# Patient Record
Sex: Female | Born: 1980 | Race: White | Hispanic: No | Marital: Married | Smoking: Former smoker
Health system: Southern US, Community
[De-identification: ages and names within clinical notes are randomized; demographics above are authoritative.]

## PROBLEM LIST (undated history)

## (undated) ENCOUNTER — Emergency Department (HOSPITAL_COMMUNITY): Admission: EM | Discharge status: Absent without leave | Payer: Self-pay

## (undated) DIAGNOSIS — O1490 Unspecified pre-eclampsia, unspecified trimester: Secondary | ICD-10-CM

## (undated) DIAGNOSIS — O10919 Unspecified pre-existing hypertension complicating pregnancy, unspecified trimester: Secondary | ICD-10-CM

## (undated) DIAGNOSIS — T7840XA Allergy, unspecified, initial encounter: Secondary | ICD-10-CM

## (undated) DIAGNOSIS — F419 Anxiety disorder, unspecified: Secondary | ICD-10-CM

## (undated) HISTORY — DX: Allergy, unspecified, initial encounter: T78.40XA

## (undated) HISTORY — DX: Anxiety disorder, unspecified: F41.9

## (undated) HISTORY — DX: Unspecified pre-existing hypertension complicating pregnancy, unspecified trimester: O10.919

## (undated) HISTORY — DX: Unspecified pre-eclampsia, unspecified trimester: O14.90

---
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
## 2002-07-31 ENCOUNTER — Other Ambulatory Visit: Admission: RE | Admit: 2002-07-31 | Discharge: 2002-07-31 | Payer: Self-pay | Admitting: Obstetrics and Gynecology

## 2002-11-01 ENCOUNTER — Inpatient Hospital Stay (HOSPITAL_COMMUNITY): Admission: AD | Admit: 2002-11-01 | Discharge: 2002-11-01 | Payer: Self-pay | Admitting: Obstetrics and Gynecology

## 2002-11-16 ENCOUNTER — Inpatient Hospital Stay (HOSPITAL_COMMUNITY): Admission: AD | Admit: 2002-11-16 | Discharge: 2002-11-19 | Payer: Self-pay | Admitting: Obstetrics and Gynecology

## 2003-02-14 ENCOUNTER — Other Ambulatory Visit: Admission: RE | Admit: 2003-02-14 | Discharge: 2003-02-14 | Payer: Self-pay | Admitting: Obstetrics and Gynecology

## 2004-09-03 ENCOUNTER — Emergency Department: Payer: Self-pay | Admitting: Emergency Medicine

## 2004-09-05 ENCOUNTER — Inpatient Hospital Stay (HOSPITAL_COMMUNITY): Admission: AD | Admit: 2004-09-05 | Discharge: 2004-09-05 | Payer: Self-pay | Admitting: Obstetrics and Gynecology

## 2004-09-07 ENCOUNTER — Inpatient Hospital Stay (HOSPITAL_COMMUNITY): Admission: AD | Admit: 2004-09-07 | Discharge: 2004-09-07 | Payer: Self-pay | Admitting: Obstetrics and Gynecology

## 2005-12-28 ENCOUNTER — Other Ambulatory Visit: Admission: RE | Admit: 2005-12-28 | Discharge: 2005-12-28 | Payer: Self-pay | Admitting: Obstetrics and Gynecology

## 2006-07-18 ENCOUNTER — Inpatient Hospital Stay (HOSPITAL_COMMUNITY): Admission: RE | Admit: 2006-07-18 | Discharge: 2006-07-20 | Payer: Self-pay | Admitting: Obstetrics and Gynecology

## 2006-08-26 ENCOUNTER — Other Ambulatory Visit: Admission: RE | Admit: 2006-08-26 | Discharge: 2006-08-26 | Payer: Self-pay | Admitting: Obstetrics and Gynecology

## 2007-01-23 ENCOUNTER — Other Ambulatory Visit: Admission: RE | Admit: 2007-01-23 | Discharge: 2007-01-23 | Payer: Self-pay | Admitting: Obstetrics and Gynecology

## 2010-12-27 ENCOUNTER — Emergency Department (HOSPITAL_COMMUNITY): Payer: BC Managed Care – PPO

## 2010-12-27 ENCOUNTER — Emergency Department (HOSPITAL_COMMUNITY)
Admission: EM | Admit: 2010-12-27 | Discharge: 2010-12-27 | Disposition: A | Discharge status: Home / Self Care | Payer: BC Managed Care – PPO | Attending: Emergency Medicine | Admitting: Emergency Medicine

## 2010-12-27 DIAGNOSIS — S025XXA Fracture of tooth (traumatic), initial encounter for closed fracture: Secondary | ICD-10-CM | POA: Insufficient documentation

## 2010-12-27 DIAGNOSIS — W010XXA Fall on same level from slipping, tripping and stumbling without subsequent striking against object, initial encounter: Secondary | ICD-10-CM | POA: Insufficient documentation

## 2010-12-27 DIAGNOSIS — IMO0002 Reserved for concepts with insufficient information to code with codable children: Secondary | ICD-10-CM | POA: Insufficient documentation

## 2010-12-27 DIAGNOSIS — S0180XA Unspecified open wound of other part of head, initial encounter: Secondary | ICD-10-CM | POA: Insufficient documentation

## 2010-12-27 DIAGNOSIS — S0990XA Unspecified injury of head, initial encounter: Secondary | ICD-10-CM | POA: Insufficient documentation

## 2010-12-27 DIAGNOSIS — M542 Cervicalgia: Secondary | ICD-10-CM | POA: Insufficient documentation

## 2010-12-27 DIAGNOSIS — S022XXA Fracture of nasal bones, initial encounter for closed fracture: Secondary | ICD-10-CM | POA: Insufficient documentation

## 2010-12-27 DIAGNOSIS — F411 Generalized anxiety disorder: Secondary | ICD-10-CM | POA: Insufficient documentation

## 2010-12-27 DIAGNOSIS — S01501A Unspecified open wound of lip, initial encounter: Secondary | ICD-10-CM | POA: Insufficient documentation

## 2010-12-27 LAB — POCT PREGNANCY, URINE: Preg Test, Ur: NEGATIVE
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
## 2016-08-09 DIAGNOSIS — Z01419 Encounter for gynecological examination (general) (routine) without abnormal findings: Secondary | ICD-10-CM | POA: Diagnosis not present

## 2016-08-09 DIAGNOSIS — Z6826 Body mass index (BMI) 26.0-26.9, adult: Secondary | ICD-10-CM | POA: Diagnosis not present

## 2016-09-14 DIAGNOSIS — I10 Essential (primary) hypertension: Secondary | ICD-10-CM | POA: Diagnosis not present

## 2016-09-14 DIAGNOSIS — Z6826 Body mass index (BMI) 26.0-26.9, adult: Secondary | ICD-10-CM | POA: Diagnosis not present

## 2016-10-01 ENCOUNTER — Encounter: Payer: Self-pay | Admitting: Family Medicine

## 2017-07-13 ENCOUNTER — Other Ambulatory Visit: Payer: Self-pay | Admitting: Family Medicine

## 2017-07-13 MED ORDER — VARENICLINE TARTRATE 0.5 MG X 11 & 1 MG X 42 PO MISC: ORAL | 0 refills | Status: DC

## 2017-07-13 NOTE — Progress Notes (Signed)
Patient is a mother of a patient seen for a well-child check. She is going to be a new patient in my office. She is very interested in smoking cessation. She is currently only taking lisinopril. I told the patient that I would gladly prescribe Chantix which she is interested in. She'll go ahead and start the starter pack and I will follow-up with the patient within the next month with the physical and lab work.

## 2017-07-18 ENCOUNTER — Ambulatory Visit (INDEPENDENT_AMBULATORY_CARE_PROVIDER_SITE_OTHER): Payer: BLUE CROSS/BLUE SHIELD | Admitting: Family Medicine

## 2017-07-18 ENCOUNTER — Encounter: Payer: Self-pay | Admitting: Family Medicine

## 2017-07-18 VITALS — BP 160/100 | HR 76 | Temp 98.7°F | Resp 14 | Ht 64.0 in | Wt 164.0 lb

## 2017-07-18 DIAGNOSIS — B079 Viral wart, unspecified: Secondary | ICD-10-CM

## 2017-07-18 DIAGNOSIS — I10 Essential (primary) hypertension: Secondary | ICD-10-CM | POA: Diagnosis not present

## 2017-07-18 DIAGNOSIS — F419 Anxiety disorder, unspecified: Secondary | ICD-10-CM | POA: Diagnosis not present

## 2017-07-18 LAB — CBC WITH DIFFERENTIAL/PLATELET
Basophils Absolute: 66 cells/uL (ref 0–200)
Basophils Relative: 1 %
Eosinophils Absolute: 198 cells/uL (ref 15–500)
Eosinophils Relative: 3 %
HCT: 45 % (ref 35.0–45.0)
Hemoglobin: 14.6 g/dL (ref 12.0–15.0)
Lymphocytes Relative: 24 %
Lymphs Abs: 1584 cells/uL (ref 850–3900)
MCH: 31.1 pg (ref 27.0–33.0)
MCHC: 32.4 g/dL (ref 32.0–36.0)
MCV: 95.7 fL (ref 80.0–100.0)
MPV: 10 fL (ref 7.5–12.5)
Monocytes Absolute: 528 cells/uL (ref 200–950)
Monocytes Relative: 8 %
Neutro Abs: 4224 cells/uL (ref 1500–7800)
Neutrophils Relative %: 64 %
Platelets: 242 10*3/uL (ref 140–400)
RBC: 4.7 MIL/uL (ref 3.80–5.10)
RDW: 13.3 % (ref 11.0–15.0)
WBC: 6.6 10*3/uL (ref 3.8–10.8)

## 2017-07-18 MED ORDER — HYDROCHLOROTHIAZIDE 12.5 MG PO CAPS: 12.5000 mg | ORAL_CAPSULE | Freq: Every day | ORAL | 3 refills | Status: AC

## 2017-07-18 NOTE — Progress Notes (Signed)
Subjective:    Patient ID: Vickie Scott, female    DOB: 1981-07-13, 36 y.o.   MRN: [id number]  HPI  Patient is a very pleasant 36 year old white female here today to establish care. Presents today with 3 concerns. 1 she has extremely high blood pressure. This is been checked numerous times at her gynecologist as well as by her at home and confirmed. Her blood pressure typically runs around 160 systolic over 100 diastolic. She has a history of pregnancy-induced hypertension as well as preeclampsia. There is also a family history of stroke in her mother at age 36 as well as a brother who has hypertension. Patient has tried the combination lisinopril hydrochlorothiazide in the past but discontinued the medication after 1 month due to excessive fatigue. Second issue is a lesion on the lateral margin of her right fourth fingernail. It is a 4 mm wartlike papule with a cauliflower-like surface and capillary hemorrhages adjacent to the proximal nail fold. She is requesting cryotherapy with liquid nitrogen to try to treat this. We discussed the risk of damaging the nail plate and she would still like to proceed with treatment. Third issue is generalized anxiety. The patient states that over the last year, she has felt extremely anxious for no reason. Her mind Costley goes to worse case scenario. She is constantly plagued by the feeling that something bad is going to happen even though her rational mind knows that everything is okay. She feels extremely anxious without provocation. She denies any depression. Family history is significant for a maternal uncle who committed suicide, maternal grandmother who had bipolar and also had suicide attempts, and a mother who had a history of substance abuse/prescription medication abuse. Past Medical History:  Diagnosis Date  . Allergy   . Anxiety   . HTN in pregnancy, chronic   . Preeclampsia    No past surgical history on file. Current Outpatient Prescriptions on  File Prior to Visit  Medication Sig Dispense Refill  . varenicline (CHANTIX STARTING MONTH PAK) 0.5 MG X 11 & 1 MG X 42 tablet Take one 0.5 mg tablet by mouth once daily for 3 days, then increase to one 0.5 mg tablet twice daily for 4 days, then increase to one 1 mg tablet twice daily. 53 tablet 0   No current facility-administered medications on file prior to visit.    No Known Allergies Social History   Social History  . Marital status: Married    Spouse name: N/A  . Number of children: N/A  . Years of education: N/A   Occupational History  . Not on file.   Social History Main Topics  . Smoking status: Former Games developer  . Smokeless tobacco: Never Used  . Alcohol use 0.6 oz/week    1 Glasses of wine per week  . Drug use: No  . Sexual activity: Not on file   Other Topics Concern  . Not on file   Social History Narrative  . No narrative on file   Family History  Problem Relation Age of Onset  . Stroke Mother   . Hypertension Mother   . Hyperlipidemia Mother   . Hypertension Brother   . Mental illness Maternal Uncle      Review of Systems  All other systems reviewed and are negative.      Objective:   Physical Exam  Constitutional: She is oriented to person, place, and time. She appears well-developed and well-nourished. No distress.  HENT:  Head: Normocephalic and atraumatic.  Neck: Neck supple. No thyromegaly present.  Cardiovascular: Normal rate, regular rhythm, normal heart sounds and intact distal pulses.  Exam reveals no gallop and no friction rub.   No murmur heard. Pulmonary/Chest: Effort normal and breath sounds normal. No respiratory distress. She has no wheezes. She has no rales.  Abdominal: Soft. Bowel sounds are normal. She exhibits no distension. There is no tenderness. There is no rebound and no guarding.  Lymphadenopathy:    She has no cervical adenopathy.  Neurological: She is alert and oriented to person, place, and time. She has normal  reflexes. She displays normal reflexes. No cranial nerve deficit. She exhibits normal muscle tone. Coordination normal.  Skin: She is not diaphoretic.  Vitals reviewed.         Assessment & Plan:  Anxiety - Plan: TSH  Benign essential HTN - Plan: CBC with Differential/Platelet, COMPLETE METABOLIC PANEL WITH GFR, Lipid panel, US Renal Artery Stenosis, US Renal  Given her anxiety and her elevated blood pressure, will check a TSH to evaluate for hyperthyroidism. We discussed possibly starting an SSRI to manage anxiety however I recommended that we control her blood pressure first. Given her age, the elevation in her blood pressure, and her history of preeclampsia, I am concerned about renovascular hypertension. I did recommend obtaining an ultrasound of the kidneys as well as an ultrasound to evaluate for renal artery stenosis or fibromuscular dysplasia. Meanwhile start the patient on hydrochlorothiazide 12.5 mg 1 by mouth daily and recheck blood pressure in one month. I will also check a CBC, CMP, fasting lipid panel. The wart on the lateral margin of her right fourth fingernail was treated with liquid nitrogen cryotherapy for a total of 30 seconds without complication. Reassess the patient in one month

## 2017-07-19 ENCOUNTER — Encounter: Payer: Self-pay | Admitting: Family Medicine

## 2017-07-19 LAB — COMPLETE METABOLIC PANEL WITH GFR
ALT: 42 U/L — ABNORMAL HIGH (ref 6–29)
AST: 34 U/L — ABNORMAL HIGH (ref 10–30)
Albumin: 4.6 g/dL (ref 3.6–5.1)
Alkaline Phosphatase: 64 U/L (ref 33–115)
BUN: 11 mg/dL (ref 7–25)
CO2: 20 mmol/L (ref 20–32)
Calcium: 9.3 mg/dL (ref 8.6–10.2)
Chloride: 106 mmol/L (ref 98–110)
Creat: 0.64 mg/dL (ref 0.50–1.10)
GFR, Est African American: >89 mL/min (ref >=60)
GFR, Est Non African American: >89 mL/min (ref >=60)
Glucose, Bld: 82 mg/dL (ref 70–99)
Potassium: 4.8 mmol/L (ref 3.5–5.3)
Sodium: 139 mmol/L (ref 135–146)
Total Bilirubin: 0.5 mg/dL (ref 0.2–1.2)
Total Protein: 7.2 g/dL (ref 6.1–8.1)

## 2017-07-19 LAB — LIPID PANEL
Cholesterol: 231 mg/dL — ABNORMAL HIGH (ref <200)
HDL: 77 mg/dL (ref >50)
LDL Cholesterol: 127 mg/dL — ABNORMAL HIGH (ref <100)
Total CHOL/HDL Ratio: 3 Ratio (ref <5.0)
Triglycerides: 135 mg/dL (ref <150)
VLDL: 27 mg/dL (ref <30)

## 2017-07-19 LAB — TSH: TSH: 2.07 mIU/L

## 2017-07-27 ENCOUNTER — Telehealth: Payer: Self-pay | Admitting: *Deleted

## 2017-07-27 NOTE — Telephone Encounter (Signed)
Received call from patient.   Reports that she has some concerns and questions about her recent labs. Requested call to discuss.   Call placed to patient. No answer. No VM.

## 2017-07-28 NOTE — Telephone Encounter (Signed)
Call placed to patient.   Reports that she has concerns about "Fatty liver disease".   Advised that non-alcoholic fatty liver disease (NAFLD) is the build up of extra fat in liver cells that is not caused by alcohol. It is normal for the liver to contain some fat. However, if more than 5% - 10% percent of the liver's weight is fat, then it is called a fatty liver (steatosis). NAFLD tends to develop in people who are overweight or obese or have diabetes, high cholesterol or high triglycerides. Rapid weight loss and poor eating habits also may lead to NAFLD. There are no medical treatments yet for NAFLD. Eating a healthy diet and exercising regularly may help prevent liver damage from starting or reverse it in the early stages. Try to avoid fried foods and fatty foods (red meats, pork products, whole milk products, and egg yolks) and try to eat more fresh fruits and vegetables.

## 2017-08-03 ENCOUNTER — Other Ambulatory Visit: Payer: Self-pay

## 2017-08-04 ENCOUNTER — Ambulatory Visit: Payer: Self-pay | Admitting: Family Medicine

## 2017-08-11 ENCOUNTER — Other Ambulatory Visit: Payer: Self-pay

## 2017-08-18 ENCOUNTER — Encounter: Payer: Self-pay | Admitting: Family Medicine

## 2017-08-18 ENCOUNTER — Ambulatory Visit (INDEPENDENT_AMBULATORY_CARE_PROVIDER_SITE_OTHER): Payer: BLUE CROSS/BLUE SHIELD | Admitting: Family Medicine

## 2017-08-18 VITALS — BP 140/100 | HR 78 | Temp 99.4°F | Resp 16 | Ht 64.0 in | Wt 165.0 lb

## 2017-08-18 DIAGNOSIS — R7989 Other specified abnormal findings of blood chemistry: Secondary | ICD-10-CM

## 2017-08-18 DIAGNOSIS — Z716 Tobacco abuse counseling: Secondary | ICD-10-CM

## 2017-08-18 DIAGNOSIS — F419 Anxiety disorder, unspecified: Secondary | ICD-10-CM | POA: Diagnosis not present

## 2017-08-18 DIAGNOSIS — I10 Essential (primary) hypertension: Secondary | ICD-10-CM | POA: Diagnosis not present

## 2017-08-18 DIAGNOSIS — R945 Abnormal results of liver function studies: Secondary | ICD-10-CM

## 2017-08-18 MED ORDER — VARENICLINE TARTRATE 1 MG PO TABS
1.0000 mg | ORAL_TABLET | Freq: Two times a day (BID) | ORAL | 3 refills | Status: DC
Start: 2017-08-18 — End: 2020-02-25

## 2017-08-18 MED ORDER — LOSARTAN POTASSIUM-HCTZ 50-12.5 MG PO TABS: 1.0000 | ORAL_TABLET | Freq: Every day | ORAL | 3 refills | Status: DC

## 2017-08-18 NOTE — Progress Notes (Signed)
Subjective:    Patient ID: Vickie Scott, female    DOB: 03-Jun-1981, 36 y.o.   MRN: [id number]  HPI 07/18/17 Patient is a very pleasant 36 year old white female here today to establish care. Presents today with 3 concerns. 1 she has extremely high blood pressure. This is been checked numerous times at her gynecologist as well as by her at home and confirmed. Her blood pressure typically runs around 160 systolic over 100 diastolic. She has a history of pregnancy-induced hypertension as well as preeclampsia. There is also a family history of stroke in her mother at age 58 as well as a brother who has hypertension. Patient has tried the combination lisinopril hydrochlorothiazide in the past but discontinued the medication after 1 month due to excessive fatigue. Second issue is a lesion on the lateral margin of her right fourth fingernail. It is a 4 mm wartlike papule with a cauliflower-like surface and capillary hemorrhages adjacent to the proximal nail fold. She is requesting cryotherapy with liquid nitrogen to try to treat this. We discussed the risk of damaging the nail plate and she would still like to proceed with treatment. Third issue is generalized anxiety. The patient states that over the last year, she has felt extremely anxious for no reason. Her mind Costley goes to worse case scenario. She is constantly plagued by the feeling that something bad is going to happen even though her rational mind knows that everything is okay. She feels extremely anxious without provocation. She denies any depression. Family history is significant for a maternal uncle who committed suicide, maternal grandmother who had bipolar and also had suicide attempts, and a mother who had a history of substance abuse/prescription medication abuse.  At that time, my plan was: Given her anxiety and her elevated blood pressure, will check a TSH to evaluate for hyperthyroidism. We discussed possibly starting an SSRI to manage anxiety  however I recommended that we control her blood pressure first. Given her age, the elevation in her blood pressure, and her history of preeclampsia, I am concerned about renovascular hypertension. I did recommend obtaining an ultrasound of the kidneys as well as an ultrasound to evaluate for renal artery stenosis or fibromuscular dysplasia. Meanwhile start the patient on hydrochlorothiazide 12.5 mg 1 by mouth daily and recheck blood pressure in one month. I will also check a CBC, CMP, fasting lipid panel. The wart on the lateral margin of her right fourth fingernail was treated with liquid nitrogen cryotherapy for a total of 30 seconds without complication. Reassess the patient in one month  08/18/17 Her blood pressure has improved significantly. The majority of her blood pressures are 135-145/90-95. However her diastolic blood pressures are still consistently elevated despite being on hydrochlorothiazide. She never went for the renal ultrasound. She canceled that. Lab work was obtained that showed only mild elevations in her liver function tests which I believe are due to fatty liver disease. She does not drink. She does not use Tylenol. She denies any exposure to viral hepatitis. Over the last month, she is started eating a Mediterranean diet. She is trying to exercise on a daily basis and she is drastically reduced her smoking. She is decreased smoking from 2 packs a day to 4 cigarettes a day on Chantix. I'm extremely proud of the patient for all the changes she is tried to make in her life. She continues to report anxiety. She also reports OCD tendencies. For instance she has a routine that she has to perform in  the morning or she feels extremely anxious like something bad will happen. She has to shave her legs twice, brush her teeth, then come her hair and put on her deodorant in that order. If everything is not done in order in for a certain length of time, she feels anxious. However at the present time she  is not interested in medication to help manage this Past Medical History:  Diagnosis Date  . Allergy   . Anxiety   . HTN in pregnancy, chronic   . Preeclampsia    No past surgical history on file. Current Outpatient Prescriptions on File Prior to Visit  Medication Sig Dispense Refill  . hydrochlorothiazide (MICROZIDE) 12.5 MG capsule Take 1 capsule (12.5 mg total) by mouth daily. 30 capsule 3   No current facility-administered medications on file prior to visit.    No Known Allergies Social History   Social History  . Marital status: Married    Spouse name: N/A  . Number of children: N/A  . Years of education: N/A   Occupational History  . Not on file.   Social History Main Topics  . Smoking status: Former Games developer  . Smokeless tobacco: Never Used  . Alcohol use 0.6 oz/week    1 Glasses of wine per week  . Drug use: No  . Sexual activity: Not on file   Other Topics Concern  . Not on file   Social History Narrative  . No narrative on file   Family History  Problem Relation Age of Onset  . Stroke Mother        died at 62  . Hypertension Mother   . Hyperlipidemia Mother   . Hypertension Brother   . Mental illness Maternal Uncle        suicide  . Mental illness Maternal Grandmother        bipolar do     Review of Systems  All other systems reviewed and are negative.      Objective:   Physical Exam  Constitutional: She is oriented to person, place, and time. She appears well-developed and well-nourished. No distress.  HENT:  Head: Normocephalic and atraumatic.  Neck: Neck supple. No thyromegaly present.  Cardiovascular: Normal rate, regular rhythm, normal heart sounds and intact distal pulses.  Exam reveals no gallop and no friction rub.   No murmur heard. Pulmonary/Chest: Effort normal and breath sounds normal. No respiratory distress. She has no wheezes. She has no rales.  Abdominal: Soft. Bowel sounds are normal. She exhibits no distension. There is no  tenderness. There is no rebound and no guarding.  Lymphadenopathy:    She has no cervical adenopathy.  Neurological: She is alert and oriented to person, place, and time. She has normal reflexes. No cranial nerve deficit. She exhibits normal muscle tone. Coordination normal.  Skin: She is not diaphoretic.  Vitals reviewed.         Assessment & Plan:  Benign essential HTN - Plan: losartan-hydrochlorothiazide (HYZAAR) 50-12.5 MG tablet  Anxiety  Tobacco abuse counseling  Elevated LFTs  Blood pressure is almost under control. Discontinue hydrochlorothiazide and replace with Hyzaar 50/12.5 one by mouth daily and recheck blood pressure via telephone in one month. I believe the patient has generalized anxiety disorder and OCD tendencies. She may benefit from an SSRI but at the present time the patient is not interested in further medication. Simply monitor this clinically unless it causes her to have a negative quality of life. She is trying to quit smoking.  Therefore I refilled her Chantix. I believe her liver function tests are secondary to fatty liver disease. I encouraged aerobic exercise 30 minutes a day 5 days a week, a Mediterranean diet, and 10-15 pounds weight loss. Recheck liver function test in 3 months.

## 2017-11-02 ENCOUNTER — Encounter: Payer: Self-pay | Admitting: Family Medicine

## 2017-11-18 ENCOUNTER — Ambulatory Visit: Payer: BLUE CROSS/BLUE SHIELD | Admitting: Family Medicine

## 2017-12-15 ENCOUNTER — Encounter: Payer: Self-pay | Admitting: Family Medicine

## 2018-08-29 ENCOUNTER — Encounter: Payer: Self-pay | Admitting: Family Medicine

## 2018-08-29 DIAGNOSIS — Z01419 Encounter for gynecological examination (general) (routine) without abnormal findings: Secondary | ICD-10-CM | POA: Diagnosis not present

## 2018-08-29 DIAGNOSIS — R5383 Other fatigue: Secondary | ICD-10-CM | POA: Diagnosis not present

## 2018-08-29 DIAGNOSIS — I10 Essential (primary) hypertension: Secondary | ICD-10-CM | POA: Diagnosis not present

## 2018-08-29 DIAGNOSIS — Z6831 Body mass index (BMI) 31.0-31.9, adult: Secondary | ICD-10-CM | POA: Diagnosis not present

## 2018-08-29 DIAGNOSIS — Z124 Encounter for screening for malignant neoplasm of cervix: Secondary | ICD-10-CM | POA: Diagnosis not present

## 2018-09-04 ENCOUNTER — Other Ambulatory Visit: Payer: Self-pay | Admitting: Family Medicine

## 2018-09-04 DIAGNOSIS — I10 Essential (primary) hypertension: Secondary | ICD-10-CM

## 2018-09-15 ENCOUNTER — Encounter: Payer: Self-pay | Admitting: Family Medicine

## 2018-09-26 ENCOUNTER — Encounter: Payer: Self-pay | Admitting: Family Medicine

## 2018-09-26 DIAGNOSIS — R748 Abnormal levels of other serum enzymes: Secondary | ICD-10-CM | POA: Diagnosis not present

## 2018-09-26 DIAGNOSIS — I10 Essential (primary) hypertension: Secondary | ICD-10-CM | POA: Diagnosis not present

## 2018-09-26 DIAGNOSIS — Z6831 Body mass index (BMI) 31.0-31.9, adult: Secondary | ICD-10-CM | POA: Diagnosis not present

## 2020-02-25 ENCOUNTER — Encounter: Payer: Self-pay | Admitting: Nurse Practitioner

## 2020-02-25 ENCOUNTER — Other Ambulatory Visit: Payer: Self-pay

## 2020-02-25 ENCOUNTER — Ambulatory Visit (INDEPENDENT_AMBULATORY_CARE_PROVIDER_SITE_OTHER): Payer: Self-pay | Admitting: Nurse Practitioner

## 2020-02-25 VITALS — BP 170/96 | HR 91 | Temp 97.9°F | Resp 18 | Ht 66.0 in | Wt 171.8 lb

## 2020-02-25 DIAGNOSIS — I10 Essential (primary) hypertension: Secondary | ICD-10-CM | POA: Insufficient documentation

## 2020-02-25 DIAGNOSIS — Z13220 Encounter for screening for lipoid disorders: Secondary | ICD-10-CM

## 2020-02-25 DIAGNOSIS — B354 Tinea corporis: Secondary | ICD-10-CM

## 2020-02-25 DIAGNOSIS — R7309 Other abnormal glucose: Secondary | ICD-10-CM | POA: Diagnosis not present

## 2020-02-25 LAB — PREGNANCY, URINE: Preg Test, Ur: NEGATIVE

## 2020-02-25 MED ORDER — NEBIVOLOL HCL 5 MG PO TABS: 5.0000 mg | ORAL_TABLET | Freq: Every day | ORAL | Status: AC

## 2020-02-25 MED ORDER — TERBINAFINE HCL 250 MG PO TABS: 250.0000 mg | ORAL_TABLET | Freq: Every day | ORAL | 0 refills | Status: AC

## 2020-02-25 MED ORDER — NEBIVOLOL HCL 5 MG PO TABS: 5.0000 mg | ORAL_TABLET | Freq: Every day | ORAL | 6 refills | Status: AC

## 2020-02-25 NOTE — Progress Notes (Signed)
Acute Office Visit  Subjective:    Patient ID: Vickie Scott, female    DOB: 03/21/1981, 39 y.o.   MRN: [id number]  Chief Complaint  Patient presents with  . Rash    legs,itchy, started x1 month, tried everything and no relief    HPI Patient is a 39 year old female presenting to clinic for bilateral lower extremity (knee down) rash. The rash started about 2 months ago. The rash is circular form with lighter center. The areas are pruritic. She admits to scratching and has started to drain a small amount without foul odor. No fever/chills. No others with similar sxs. She has tried steroid cream, antifungal creams that she has purchased over the counter with no relief. New areas develop that start small and then enlarge. No exposure or contact with allergins.  Pregnancy test: IUD In place per pt with GYN management, Does not have regular periods she says secondary to IUD, prior to RX for Tinea Corporis in clinic pregnancy test was negative.   Pts blood pressure is elevated today. She has not been seen in clinic since 07/2017 when her blood pressure was elevated and medications adjusted. Since then she reports that her GYN increased Losartan from 50mg  to 100mg . She takes HCTZ separately. She took both medications around 0800 this AM. She does not monitor her blood pressures at home. No cp/ct, gu/gisxs, palpitations, edema, sob.   Past Medical History:  Diagnosis Date  . Allergy   . Anxiety   . HTN in pregnancy, chronic   . Preeclampsia     History reviewed. No pertinent surgical history.  Family History  Problem Relation Age of Onset  . Stroke Mother        died at 13  . Hypertension Mother   . Hyperlipidemia Mother   . Hypertension Brother   . Mental illness Maternal Uncle        suicide  . Mental illness Maternal Grandmother        bipolar do    Social History   Socioeconomic History  . Marital status: Married    Spouse name: Not on file  . Number of children: Not on  file  . Years of education: Not on file  . Highest education level: Not on file  Occupational History  . Not on file  Tobacco Use  . Smoking status: Former  . Smokeless tobacco: Never Used  Substance and Sexual Activity  . Alcohol use: Yes    Alcohol/week: 1.0 standard drinks    Types: 1 Glasses of wine per week  . Drug use: No  . Sexual activity: Not on file  Other Topics Concern  . Not on file  Social History Narrative  . Not on file   Social Determinants of Health   Financial Resource Strain:   . Difficulty of Paying Living Expenses:   Food Insecurity:   . Worried About 40 in the Last Year:   . Games developer in the Last Year:   Transportation Needs:   . Programme researcher, broadcasting/film/video (Medical):   Barista Lack of Transportation (Non-Medical):   Physical Activity:   . Days of Exercise per Week:   . Minutes of Exercise per Session:   Stress:   . Feeling of Stress :   Social Connections:   . Frequency of Communication with Friends and Family:   . Frequency of Social Gatherings with Friends and Family:   . Attends Religious Services:   .  Active Member of Clubs or Organizations:   . Attends Archivist Meetings:   Marland Kitchen Marital Status:   Intimate Partner Violence:   . Fear of Current or Ex-Partner:   . Emotionally Abused:   Marland Kitchen Physically Abused:   . Sexually Abused:     Outpatient Medications Prior to Visit  Medication Sig Dispense Refill  . hydrochlorothiazide (MICROZIDE) 12.5 MG capsule Take 1 capsule (12.5 mg total) by mouth daily. 30 capsule 3  . losartan (COZAAR) 100 MG tablet Take 100 mg by mouth daily.    Marland Kitchen losartan-hydrochlorothiazide (HYZAAR) 50-12.5 MG tablet Take 1 tablet by mouth daily. 90 tablet 3  . varenicline (CHANTIX CONTINUING MONTH PAK) 1 MG tablet Take 1 tablet (1 mg total) by mouth 2 (two) times daily. 60 tablet 3   No facility-administered medications prior to visit.    No Known Allergies  Review of Systems  All other  systems reviewed and are negative.      Objective:    Physical Exam Vitals and nursing note reviewed.  Constitutional:      Appearance: Normal appearance. She is well-developed and well-groomed.  HENT:     Head: Normocephalic.     Right Ear: Hearing normal.     Left Ear: Hearing normal.     Nose: Nose normal.     Mouth/Throat:     Lips: Pink.  Eyes:     General: Lids are normal. Lids are everted, no foreign bodies appreciated.     Extraocular Movements: Extraocular movements intact.     Conjunctiva/sclera: Conjunctivae normal.     Pupils: Pupils are equal, round, and reactive to light.  Neck:     Thyroid: No thyromegaly.     Vascular: No carotid bruit or JVD.  Cardiovascular:     Rate and Rhythm: Normal rate and regular rhythm.     Pulses: Normal pulses.     Heart sounds: Normal heart sounds, S1 normal and S2 normal.  Pulmonary:     Effort: Pulmonary effort is normal.     Breath sounds: Normal breath sounds.  Chest:     Chest wall: No deformity.  Abdominal:     General: Abdomen is flat.     Palpations: Abdomen is soft.  Musculoskeletal:        General: Normal range of motion.     Cervical back: Normal range of motion and neck supple.     Right lower leg: No edema.     Left lower leg: No edema.  Lymphadenopathy:     Cervical: No cervical adenopathy.     Right cervical: No superficial cervical adenopathy.    Left cervical: No superficial cervical adenopathy.  Skin:    General: Skin is warm and dry.     Capillary Refill: Capillary refill takes less than 2 seconds.  Neurological:     General: No focal deficit present.     Mental Status: She is alert and oriented to person, place, and time.  Psychiatric:        Attention and Perception: Attention normal.        Mood and Affect: Mood normal.        Speech: Speech normal.        Behavior: Behavior normal. Behavior is cooperative.        Cognition and Memory: Cognition normal.        Judgment: Judgment normal.      BP (!) 180/110   Pulse (!) 110   Temp 97.9 F (36.6 C) (  Temporal)   Resp 18   Ht 5\' 6"  (1.676 m)   Wt 171 lb 12.8 oz (77.9 kg)   SpO2 98%   BMI 27.73 kg/m  Wt Readings from Last 3 Encounters:  02/25/20 171 lb 12.8 oz (77.9 kg)  08/18/17 165 lb (74.8 kg)  07/18/17 164 lb (74.4 kg)    There are no preventive care reminders to display for this patient.  There are no preventive care reminders to display for this patient.   Lab Results  Component Value Date   TSH 2.07 07/18/2017   Lab Results  Component Value Date   WBC 6.6 07/18/2017   HGB 14.6 07/18/2017   HCT 45.0 07/18/2017   MCV 95.7 07/18/2017   PLT 242 07/18/2017   Lab Results  Component Value Date   NA 139 07/18/2017   K 4.8 07/18/2017   CO2 20 07/18/2017   GLUCOSE 82 07/18/2017   BUN 11 07/18/2017   CREATININE 0.64 07/18/2017   BILITOT 0.5 07/18/2017   ALKPHOS 64 07/18/2017   AST 34 (H) 07/18/2017   ALT 42 (H) 07/18/2017   PROT 7.2 07/18/2017   ALBUMIN 4.6 07/18/2017   CALCIUM 9.3 07/18/2017   Lab Results  Component Value Date   CHOL 231 (H) 07/18/2017   Lab Results  Component Value Date   HDL 77 07/18/2017   Lab Results  Component Value Date   LDLCALC 127 (H) 07/18/2017   Lab Results  Component Value Date   TRIG 135 07/18/2017   Lab Results  Component Value Date   CHOLHDL 3.0 07/18/2017   No results found for: HGBA1C     Assessment & Plan:  07/20/2017 blood pressure at home 3 hours after taking your blood pressure controlling medications Bring the log to next appointment. Follow up as planned in 2 weeks with new RX Bystolic added however if BP readings are >140/90 follow up sooner.   Blood pressure in clinic on arrival was 172/116 HR 110, rechecked by provider 180/115 HR 110. Bystolic 5 mg given in clinic today. The blood pressure after 10 minutes was   Tinea Corporis: ring worm, take medication called into pharmacy as instructed  Problem List Items Addressed This Visit       Cardiovascular and Mediastinum   Hypertension   Relevant Medications   losartan (COZAAR) 100 MG tablet   nebivolol (BYSTOLIC) tablet 5 mg (Start on 02/25/2020 11:45 AM)   nebivolol (BYSTOLIC) 5 MG tablet   Other Relevant Orders   Pregnancy, urine   COMPLETE METABOLIC PANEL WITH GFR   CBC with Differential/Platelet     Musculoskeletal and Integument   Tinea corporis - Primary   Relevant Medications   terbinafine (LAMISIL) 250 MG tablet   Other Relevant Orders   Pregnancy, urine    Other Visit Diagnoses    Screening, lipid       Relevant Orders   Lipid panel      Follow Up: 2 weeks for follow up HTN and Tinea Corporis   04/26/2020, FNP

## 2020-02-28 ENCOUNTER — Other Ambulatory Visit: Payer: Self-pay | Admitting: Nurse Practitioner

## 2020-02-28 DIAGNOSIS — R7309 Other abnormal glucose: Secondary | ICD-10-CM

## 2020-02-28 DIAGNOSIS — E782 Mixed hyperlipidemia: Secondary | ICD-10-CM

## 2020-02-28 MED ORDER — ATORVASTATIN CALCIUM 20 MG PO TABS: 20.0000 mg | ORAL_TABLET | Freq: Every day | ORAL | 3 refills | Status: AC

## 2020-02-28 NOTE — Progress Notes (Signed)
Add a1c as glucose elevated, low carb, sugar cholesterol diet, I will prescribe a statin for cholesterol. Make sure she is not pregnant prior to starting statin please and document that you let her know.  Is she drinking alcohol? Her cholesterol and liver enzymes are elevated. I see this historically but is a bet higher.....  Follow up in 6 months repeat labs.

## 2020-03-01 LAB — CBC WITH DIFFERENTIAL/PLATELET
Absolute Monocytes: 632 cells/uL (ref 200–950)
Basophils Absolute: 70 cells/uL (ref 0–200)
Basophils Relative: 0.9 %
Eosinophils Absolute: 218 cells/uL (ref 15–500)
Eosinophils Relative: 2.8 %
HCT: 41.3 % (ref 35.0–45.0)
Hemoglobin: 13.9 g/dL (ref 11.7–15.5)
Lymphs Abs: 1498 cells/uL (ref 850–3900)
MCH: 32.3 pg (ref 27.0–33.0)
MCHC: 33.7 g/dL (ref 32.0–36.0)
MCV: 95.8 fL (ref 80.0–100.0)
MPV: 10.6 fL (ref 7.5–12.5)
Monocytes Relative: 8.1 %
Neutro Abs: 5382 cells/uL (ref 1500–7800)
Neutrophils Relative %: 69 %
Platelets: 201 10*3/uL (ref 140–400)
RBC: 4.31 10*6/uL (ref 3.80–5.10)
RDW: 12 % (ref 11.0–15.0)
Total Lymphocyte: 19.2 %
WBC: 7.8 10*3/uL (ref 3.8–10.8)

## 2020-03-01 LAB — COMPLETE METABOLIC PANEL WITH GFR
AG Ratio: 1.9 (calc) (ref 1.0–2.5)
ALT: 67 U/L — ABNORMAL HIGH (ref 6–29)
AST: 41 U/L — ABNORMAL HIGH (ref 10–30)
Albumin: 4.6 g/dL (ref 3.6–5.1)
Alkaline phosphatase (APISO): 61 U/L (ref 31–125)
BUN: 13 mg/dL (ref 7–25)
CO2: 25 mmol/L (ref 20–32)
Calcium: 9.8 mg/dL (ref 8.6–10.2)
Chloride: 107 mmol/L (ref 98–110)
Creat: 0.63 mg/dL (ref 0.50–1.10)
GFR, Est African American: 132 mL/min/{1.73_m2} (ref >=60)
GFR, Est Non African American: 114 mL/min/{1.73_m2} (ref >=60)
Globulin: 2.4 g/dL (calc) (ref 1.9–3.7)
Glucose, Bld: 110 mg/dL — ABNORMAL HIGH (ref 65–99)
Potassium: 4.4 mmol/L (ref 3.5–5.3)
Sodium: 139 mmol/L (ref 135–146)
Total Bilirubin: 0.5 mg/dL (ref 0.2–1.2)
Total Protein: 7 g/dL (ref 6.1–8.1)

## 2020-03-01 LAB — LIPID PANEL
Cholesterol: 220 mg/dL — ABNORMAL HIGH (ref <200)
HDL: 68 mg/dL (ref >=50)
LDL Cholesterol (Calc): 130 mg/dL (calc) — ABNORMAL HIGH
Non-HDL Cholesterol (Calc): 152 mg/dL (calc) — ABNORMAL HIGH (ref <130)
Total CHOL/HDL Ratio: 3.2 (calc) (ref <5.0)
Triglycerides: 114 mg/dL (ref <150)

## 2020-03-01 LAB — TEST AUTHORIZATION

## 2020-03-01 LAB — HEMOGLOBIN A1C W/OUT EAG: Hgb A1c MFr Bld: 5.2 % of total Hgb (ref <5.7)

## 2020-03-10 ENCOUNTER — Ambulatory Visit: Payer: Self-pay | Admitting: Nurse Practitioner

## 2020-08-19 ENCOUNTER — Emergency Department (HOSPITAL_COMMUNITY): Payer: BC Managed Care – PPO

## 2020-08-19 ENCOUNTER — Other Ambulatory Visit: Payer: Self-pay

## 2020-08-19 ENCOUNTER — Emergency Department (HOSPITAL_COMMUNITY)
Admission: EM | Admit: 2020-08-19 | Discharge: 2020-08-20 | Disposition: A | Discharge status: Home / Self Care | Payer: BC Managed Care – PPO | Attending: Emergency Medicine | Admitting: Emergency Medicine

## 2020-08-19 DIAGNOSIS — I10 Essential (primary) hypertension: Secondary | ICD-10-CM | POA: Insufficient documentation

## 2020-08-19 DIAGNOSIS — Z87891 Personal history of nicotine dependence: Secondary | ICD-10-CM | POA: Insufficient documentation

## 2020-08-19 DIAGNOSIS — S60944A Unspecified superficial injury of right ring finger, initial encounter: Secondary | ICD-10-CM | POA: Diagnosis not present

## 2020-08-19 DIAGNOSIS — Z79899 Other long term (current) drug therapy: Secondary | ICD-10-CM | POA: Insufficient documentation

## 2020-08-19 DIAGNOSIS — W268XXA Contact with other sharp object(s), not elsewhere classified, initial encounter: Secondary | ICD-10-CM | POA: Diagnosis not present

## 2020-08-19 DIAGNOSIS — S61214A Laceration without foreign body of right ring finger without damage to nail, initial encounter: Secondary | ICD-10-CM | POA: Insufficient documentation

## 2020-08-19 DIAGNOSIS — Z23 Encounter for immunization: Secondary | ICD-10-CM | POA: Insufficient documentation

## 2020-08-19 DIAGNOSIS — S61411A Laceration without foreign body of right hand, initial encounter: Secondary | ICD-10-CM | POA: Diagnosis not present

## 2020-08-19 MED ORDER — POVIDONE-IODINE 10 % EX SOLN
CUTANEOUS | Status: AC
  Filled 2020-08-19: qty 15

## 2020-08-19 MED ORDER — TETANUS-DIPHTH-ACELL PERTUSSIS 5-2.5-18.5 LF-MCG/0.5 IM SUSP
0.5000 mL | Freq: Once | INTRAMUSCULAR | Status: AC
  Administered 2020-08-19: 0.5 mL via INTRAMUSCULAR
  Filled 2020-08-19: qty 0.5

## 2020-08-19 MED ORDER — FENTANYL CITRATE (PF) 100 MCG/2ML IJ SOLN
50.0000 ug | Freq: Once | INTRAMUSCULAR | Status: AC
  Administered 2020-08-19: 50 ug via INTRAMUSCULAR
  Filled 2020-08-19: qty 2

## 2020-08-19 MED ORDER — LIDOCAINE-EPINEPHRINE (PF) 2 %-1:200000 IJ SOLN
20.0000 mL | Freq: Once | INTRAMUSCULAR | Status: AC
  Administered 2020-08-19: 20 mL
  Filled 2020-08-19: qty 20

## 2020-08-19 NOTE — ED Triage Notes (Signed)
Pt has laceration on right ring finger. States she cut it with a knife on accident.

## 2020-08-20 DIAGNOSIS — S61411A Laceration without foreign body of right hand, initial encounter: Secondary | ICD-10-CM | POA: Diagnosis not present

## 2020-08-20 DIAGNOSIS — S61214A Laceration without foreign body of right ring finger without damage to nail, initial encounter: Secondary | ICD-10-CM | POA: Diagnosis not present

## 2020-08-20 MED ORDER — CEPHALEXIN 500 MG PO CAPS: 500.0000 mg | ORAL_CAPSULE | Freq: Four times a day (QID) | ORAL | 0 refills | Status: DC

## 2020-08-20 MED ORDER — BACITRACIN ZINC 500 UNIT/GM EX OINT: 1.0000 "application " | TOPICAL_OINTMENT | Freq: Two times a day (BID) | CUTANEOUS | 0 refills | Status: AC

## 2020-08-20 MED ORDER — BACITRACIN ZINC 500 UNIT/GM EX OINT
TOPICAL_OINTMENT | Freq: Two times a day (BID) | CUTANEOUS | Status: DC
Start: 2020-08-20 — End: 2020-08-20
  Filled 2020-08-20: qty 0.9

## 2020-08-20 NOTE — Discharge Instructions (Signed)
We saw you in the ER for your LACERATION WOUND. Please read the instructions provided on wound care. Keep the area clean and dry, apply bacitracin ointment daily and take the medications provided. RETURN TO THE ER IF THERE IS INCREASED PAIN, REDNESS, PUS COMING OUT from the wound site.

## 2020-08-20 NOTE — ED Notes (Signed)
Hand placed in betadine solution

## 2020-08-20 NOTE — ED Notes (Signed)
Wound dressed. Pt states she will follow up with PCP for suture removal.

## 2020-08-20 NOTE — ED Notes (Signed)
Pt states she was not cooking dinner. A ceramic dog treat container busted and one of the pieces cut her hand

## 2020-08-20 NOTE — ED Provider Notes (Signed)
[facility] EMERGENCY DEPARTMENT Provider Note   CSN: [id number] Arrival date & time: 08/19/20  2313     History Chief Complaint  Patient presents with  . Laceration    Vickie Scott is a 39 y.o. female.  HPI     39 year old female comes in a chief complaint of finger laceration. Patient reports that she had an accident where she cut herself with a ceramic dog treat container.  She admits to drinking some wine prior to ED arrival.  The injury occurred about 2 hours prior to ED arrival.  Patient was unable to control the bleeding at home.  She is unsure about her tetanus status.  Patient is not on any blood thinners.  Past Medical History:  Diagnosis Date  . Allergy   . Anxiety   . HTN in pregnancy, chronic   . Preeclampsia     Patient Active Problem List   Diagnosis Date Noted  . Tinea corporis 02/25/2020  . Hypertension 02/25/2020    No past surgical history on file.   OB History   No obstetric history on file.     Family History  Problem Relation Age of Onset  . Stroke Mother        died at 17  . Hypertension Mother   . Hyperlipidemia Mother   . Hypertension Brother   . Mental illness Maternal Uncle        suicide  . Mental illness Maternal Grandmother        bipolar do    Social History   Tobacco Use  . Smoking status: Former Games developer  . Smokeless tobacco: Never Used  Substance Use Topics  . Alcohol use: Yes    Alcohol/week: 1.0 standard drink    Types: 1 Glasses of wine per week  . Drug use: No    Home Medications Prior to Admission medications   Medication Sig Start Date End Date Taking? Authorizing Provider  atorvastatin (LIPITOR) 20 MG tablet Take 1 tablet (20 mg total) by mouth daily. 02/28/20   Elmore Guise, FNP  bacitracin ointment Apply 1 application topically 2 (two) times daily. 08/20/20   Derwood Kaplan, MD  cephALEXin (KEFLEX) 500 MG capsule Take 1 capsule (500 mg total) by mouth 4 (four) times daily. 08/20/20   Derwood Kaplan,  MD  hydrochlorothiazide (MICROZIDE) 12.5 MG capsule Take 1 capsule (12.5 mg total) by mouth daily. 07/18/17   Donita Brooks, MD  losartan (COZAAR) 100 MG tablet Take 100 mg by mouth daily.    [provider]  nebivolol (BYSTOLIC) 5 MG tablet Take 1 tablet (5 mg total) by mouth daily. 02/25/20   Elmore Guise, FNP  terbinafine (LAMISIL) 250 MG tablet Take 1 tablet (250 mg total) by mouth daily. 250 mg per day for two weeks 02/25/20   Elmore Guise, FNP    Allergies    Patient has no known allergies.  Review of Systems   Review of Systems  Constitutional: Positive for activity change.  Musculoskeletal: Negative for arthralgias.  Neurological: Positive for numbness.  Hematological: Does not bruise/bleed easily.    Physical Exam Updated Vital Signs BP (!) 154/112   Pulse (!) 117   Resp 20   Ht 5\' 6"  (1.676 m)   Wt 77.9 kg   SpO2 98%   BMI 27.72 kg/m   Physical Exam Vitals and nursing note reviewed.  Constitutional:      Appearance: She is well-developed.  HENT:     Head: Normocephalic and  atraumatic.  Pulmonary:     Effort: Pulmonary effort is normal.  Musculoskeletal:     Comments: Patient able to flex and extend over the PIP, DIP and MCP joint over the right ring finger.  Patient able to abduct, adduct the digits of her right hand.  Skin:    General: Skin is warm and dry.     Comments: Patient has a 3 cm laceration just distal to the MCP joint over the right index finger volarly.  The laceration is moving to the ulnar side and crosses the PIP joint.  The laceration is deep but there is no foreign body appreciated  Neurological:     Mental Status: She is alert and oriented to person, place, and time.     Comments: Patient has numbness over the ulnar aspect of the ring finger distal to the PIP joint.  The numbness is more pronounced distal to DIP.     ED Results / Procedures / Treatments   Labs (all labs ordered are listed, but only abnormal results are  displayed) Labs Reviewed - No data to display  EKG None  Radiology DG Finger Ring Right  Result Date: 08/20/2020 CLINICAL DATA:  Status post trauma. EXAM: RIGHT RING FINGER 2+V COMPARISON:  None. FINDINGS: There is no evidence of an acute fracture or dislocation. A radiopaque ring (i.e. Jewelry) is seen overlying the proximal phalanx. There is no evidence of arthropathy or other focal bone abnormality. A superficial soft tissue defect is seen along the proximal aspect of the fourth right finger. Associated focal soft tissue swelling is seen, without evidence of a radiopaque soft tissue foreign body. IMPRESSION: Soft tissue laceration without evidence of acute osseous abnormality or radiopaque soft tissue foreign body. Electronically Signed   By: Aram Candelahaddeus  Houston M.D.   On: 08/20/2020 00:15    Procedures .Nerve Block  Date/Time: 08/20/2020 1:31 AM Performed by: Derwood KaplanNanavati, Saida Lonon, MD Authorized by: Derwood KaplanNanavati, Mena Lienau, MD   Consent:    Consent obtained:  Verbal   Consent given by:  Patient   Risks discussed:  Nerve damage, pain and infection   Alternatives discussed:  No treatment Universal protocol:    Procedure explained and questions answered to patient or proxy's satisfaction: yes     Site marked: yes     Patient identity confirmed:  Arm band Indications:    Indications:  Pain relief and procedural anesthesia Location:    Body area:  Upper extremity   Upper extremity nerve:  Metacarpal   Laterality:  Right Pre-procedure details:    Skin preparation:  Alcohol   Preparation: Patient was prepped and draped in usual sterile fashion   Procedure details (see MAR for exact dosages):    Block needle gauge:  25 G   Anesthetic injected:  Lidocaine 1% WITH epi   Injection procedure:  Anatomic landmarks identified and anatomic landmarks palpated Post-procedure details:    Dressing:  Sterile dressing   Outcome:  Anesthesia achieved   Patient tolerance of procedure:  Tolerated well, no  immediate complications .Marland Kitchen.Laceration Repair  Date/Time: 08/20/2020 1:32 AM Performed by: Derwood KaplanNanavati, Karryn Kosinski, MD Authorized by: Derwood KaplanNanavati, Khaliya Golinski, MD   Consent:    Consent obtained:  Verbal   Consent given by:  Patient   Risks discussed:  Infection, pain, retained foreign body, poor cosmetic result, poor wound healing, nerve damage, tendon damage and need for additional repair   Alternatives discussed:  No treatment Universal protocol:    Procedure explained and questions answered to patient or proxy's satisfaction:  yes     Patient identity confirmed:  Arm band Laceration details:    Location:  Finger   Finger location:  R ring finger   Length (cm):  3   Depth (mm):  5 Repair type:    Repair type:  Complex Pre-procedure details:    Preparation:  Patient was prepped and draped in usual sterile fashion Exploration:    Hemostasis achieved with:  Tourniquet   Wound exploration: wound explored through full range of motion and entire depth of wound probed and visualized     Wound extent: fascia violated, nerve damage and vascular damage     Wound extent: no foreign bodies/material noted, no muscle damage noted, no tendon damage noted and no underlying fracture noted     Contaminated: yes   Treatment:    Area cleansed with:  Saline and Betadine   Amount of cleaning:  Extensive   Irrigation solution:  Sterile saline   Irrigation volume:  20   Irrigation method:  Syringe   Debridement:  None Skin repair:    Repair method:  Sutures   Suture size:  4-0   Suture material:  Nylon   Suture technique:  Simple interrupted   Number of sutures:  4 Approximation:    Approximation:  Close Post-procedure details:    Dressing:  Antibiotic ointment   Patient tolerance of procedure:  Tolerated well, no immediate complications   (including critical care time)  Medications Ordered in ED Medications  bacitracin ointment (has no administration in time range)  lidocaine-EPINEPHrine (XYLOCAINE  W/EPI) 2 %-1:200000 (PF) injection 20 mL (20 mLs Infiltration Given 08/19/20 2344)  Tdap (BOOSTRIX) injection 0.5 mL (0.5 mLs Intramuscular Given 08/19/20 2343)  fentaNYL (SUBLIMAZE) injection 50 mcg (50 mcg Intramuscular Given 08/19/20 2344)  povidone-iodine (BETADINE) 10 % external solution (  Given 08/19/20 2344)    ED Course  I have reviewed the triage vital signs and the nursing notes.  Pertinent labs & imaging results that were available during my care of the patient were reviewed by me and considered in my medical decision making (see chart for details).    MDM Rules/Calculators/A&P                          39 year old female comes in a chief complaint of finger laceration. On exam she is having extensive bleeding.  Tourniquet had to be applied to get bleeding controlled.  Patient has a irregularly shaped, complex laceration to the right ring finger.  Patient is right-handed dominant.  There was an arterial bleed which we were able to control with pressure dressing.  On exam patient appears to be having neuropraxia distally on the ulnar side of her right ring finger.  Patient received digital nerve block followed by extensive cleansing and laceration repair. Wound care return precautions discussed.  Patient will be following up with her PCP in 7 days for suture removal.  Antibiotic orally and topically prescribed given that she had contaminated wound that was exposed for at least 2 hours prior to ED arrival.  Tetanus updated.  Final Clinical Impression(s) / ED Diagnoses Final diagnoses:  Laceration of right ring finger without foreign body without damage to nail, initial encounter    Rx / DC Orders ED Discharge Orders         Ordered    bacitracin ointment  2 times daily        08/20/20 0120    cephALEXin (KEFLEX) 500 MG capsule  4 times daily        08/20/20 0120           Derwood Kaplan, MD 08/20/20 [phone]

## 2020-08-29 ENCOUNTER — Ambulatory Visit: Payer: BC Managed Care – PPO | Admitting: Family Medicine

## 2020-08-29 ENCOUNTER — Other Ambulatory Visit: Payer: Self-pay

## 2020-08-29 VITALS — BP 140/100 | HR 103 | Temp 98.6°F | Ht 66.0 in | Wt 165.0 lb

## 2020-08-29 DIAGNOSIS — Z4802 Encounter for removal of sutures: Secondary | ICD-10-CM | POA: Diagnosis not present

## 2020-08-29 NOTE — Progress Notes (Signed)
Subjective:    Patient ID: Vickie Scott, female    DOB: [birth year], 39 y.o.   MRN: [id number]  HPI Patient sustained a laceration to her right ring finger 1 week ago.  She cut it on a metal utensil.  She had to go the emergency room where she received a tetanus shot.  I reviewed the x-rays which were normal.  Patient had the wound closed with 4 simple interrupted Prolene sutures.  She is here today for suture removal.  The wound is well-healed.  There is no erythema or exudate.  The wound edges are well approximated and adherent to 1 another.  Patient is able to flex and extend her MCP joint, her PIP joint, and her DIP joint.  However there is tenderness with range of motion.  The finger is vascularly intact with no bruising or discoloration.  She does have some decreased sensation over the ulnar side of the finger distal to the PIP joint as well as some burning sensations in that area suggesting possible nerve damage Past Medical History:  Diagnosis Date  . Allergy   . Anxiety   . HTN in pregnancy, chronic   . Preeclampsia    No past surgical history on file. Current Outpatient Medications on File Prior to Visit  Medication Sig Dispense Refill  . atorvastatin (LIPITOR) 20 MG tablet Take 1 tablet (20 mg total) by mouth daily. 90 tablet 3  . bacitracin ointment Apply 1 application topically 2 (two) times daily. 14 g 0  . hydrochlorothiazide (MICROZIDE) 12.5 MG capsule Take 1 capsule (12.5 mg total) by mouth daily. 30 capsule 3  . losartan (COZAAR) 100 MG tablet Take 100 mg by mouth daily.    . nebivolol (BYSTOLIC) 5 MG tablet Take 1 tablet (5 mg total) by mouth daily. 30 tablet 6  . terbinafine (LAMISIL) 250 MG tablet Take 1 tablet (250 mg total) by mouth daily. 250 mg per day for two weeks 28 tablet 0   Current Facility-Administered Medications on File Prior to Visit  Medication Dose Route Frequency Provider Last Rate Last Admin  . nebivolol (BYSTOLIC) tablet 5 mg  5 mg Oral Daily Jenne Pane,  Crystal A, FNP       No Known Allergies Social History   Socioeconomic History  . Marital status: Married    Spouse name: Not on file  . Number of children: Not on file  . Years of education: Not on file  . Highest education level: Not on file  Occupational History  . Not on file  Tobacco Use  . Smoking status: Former Games developer  . Smokeless tobacco: Never Used  Substance and Sexual Activity  . Alcohol use: Yes    Alcohol/week: 1.0 standard drink    Types: 1 Glasses of wine per week  . Drug use: No  . Sexual activity: Not on file  Other Topics Concern  . Not on file  Social History Narrative  . Not on file   Social Determinants of Health   Financial Resource Strain:   . Difficulty of Paying Living Expenses: Not on file  Food Insecurity:   . Worried About Programme researcher, broadcasting/film/video in the Last Year: Not on file  . Ran Out of Food in the Last Year: Not on file  Transportation Needs:   . Lack of Transportation (Medical): Not on file  . Lack of Transportation (Non-Medical): Not on file  Physical Activity:   . Days of Exercise per Week: Not on file  .  Minutes of Exercise per Session: Not on file  Stress:   . Feeling of Stress : Not on file  Social Connections:   . Frequency of Communication with Friends and Family: Not on file  . Frequency of Social Gatherings with Friends and Family: Not on file  . Attends Religious Services: Not on file  . Active Member of Clubs or Organizations: Not on file  . Attends Banker Meetings: Not on file  . Marital Status: Not on file  Intimate Partner Violence:   . Fear of Current or Ex-Partner: Not on file  . Emotionally Abused: Not on file  . Physically Abused: Not on file  . Sexually Abused: Not on file   Family History  Problem Relation Age of Onset  . Stroke Mother        died at 85  . Hypertension Mother   . Hyperlipidemia Mother   . Hypertension Brother   . Mental illness Maternal Uncle        suicide  . Mental illness  Maternal Grandmother        bipolar do     Review of Systems  All other systems reviewed and are negative.      Objective:   Physical Exam Vitals reviewed.  Constitutional:      General: She is not in acute distress.    Appearance: She is well-developed. She is not diaphoretic.  HENT:     Head: Normocephalic and atraumatic.  Neck:     Thyroid: No thyromegaly.  Cardiovascular:     Rate and Rhythm: Normal rate and regular rhythm.     Heart sounds: Normal heart sounds. No murmur heard.  No friction rub. No gallop.   Pulmonary:     Effort: Pulmonary effort is normal. No respiratory distress.     Breath sounds: Normal breath sounds. No wheezing or rales.  Abdominal:     General: Bowel sounds are normal. There is no distension.     Palpations: Abdomen is soft.     Tenderness: There is no abdominal tenderness. There is no guarding or rebound.  Musculoskeletal:     Right hand: Deformity and laceration present. No swelling, tenderness or bony tenderness. Normal range of motion. Normal strength. Decreased sensation.     Cervical back: Neck supple.  Lymphadenopathy:     Cervical: No cervical adenopathy.  Neurological:     Mental Status: She is alert and oriented to person, place, and time.     Cranial Nerves: No cranial nerve deficit.     Motor: No abnormal muscle tone.     Coordination: Coordination normal.     Deep Tendon Reflexes: Reflexes are normal and symmetric.           Assessment & Plan:  Visit for suture removal  4 sutures removed without difficulty.  Finger is neurovascularly intact although there may be some mild nerve damage on the ulnar side of the finger distal to the PIP joint.  We discussed a referral to a hand surgeon for further evaluation but she declines this at the present time.  I reinforced the wound with Steri-Strips and gave the patient an aluminum splint to wear when she is using her hand to prevent repeated flexion and extension of the PIP and DIP  joints.  We did discuss range of motion exercises to perform at night to prevent scar tissue formation

## 2021-04-13 DIAGNOSIS — I10 Essential (primary) hypertension: Secondary | ICD-10-CM | POA: Diagnosis not present

## 2021-04-13 DIAGNOSIS — Z01419 Encounter for gynecological examination (general) (routine) without abnormal findings: Secondary | ICD-10-CM | POA: Diagnosis not present

## 2021-04-13 DIAGNOSIS — Z124 Encounter for screening for malignant neoplasm of cervix: Secondary | ICD-10-CM | POA: Diagnosis not present

## 2021-04-13 DIAGNOSIS — Z6828 Body mass index (BMI) 28.0-28.9, adult: Secondary | ICD-10-CM | POA: Diagnosis not present

## 2022-02-22 ENCOUNTER — Emergency Department: Payer: BC Managed Care – PPO

## 2022-02-22 ENCOUNTER — Emergency Department
Admission: EM | Admit: 2022-02-22 | Discharge: 2022-02-23 | Disposition: A | Discharge status: Home / Self Care | Payer: BC Managed Care – PPO | Attending: Emergency Medicine | Admitting: Emergency Medicine

## 2022-02-22 ENCOUNTER — Other Ambulatory Visit: Payer: Self-pay

## 2022-02-22 DIAGNOSIS — W540XXA Bitten by dog, initial encounter: Secondary | ICD-10-CM | POA: Diagnosis not present

## 2022-02-22 DIAGNOSIS — S51811A Laceration without foreign body of right forearm, initial encounter: Secondary | ICD-10-CM | POA: Insufficient documentation

## 2022-02-22 DIAGNOSIS — S51851A Open bite of right forearm, initial encounter: Secondary | ICD-10-CM | POA: Diagnosis not present

## 2022-02-22 DIAGNOSIS — S6991XA Unspecified injury of right wrist, hand and finger(s), initial encounter: Secondary | ICD-10-CM | POA: Diagnosis not present

## 2022-02-22 MED ORDER — AMOXICILLIN-POT CLAVULANATE 875-125 MG PO TABS
1.0000 | ORAL_TABLET | Freq: Once | ORAL | Status: AC
  Administered 2022-02-22: 1 via ORAL
  Filled 2022-02-22: qty 1

## 2022-02-22 MED ORDER — LIDOCAINE HCL (PF) 1 % IJ SOLN
15.0000 mL | Freq: Once | INTRAMUSCULAR | Status: AC
  Administered 2022-02-22: 15 mL
  Filled 2022-02-22: qty 15

## 2022-02-22 MED ORDER — IBUPROFEN 800 MG PO TABS
800.0000 mg | ORAL_TABLET | ORAL | Status: AC
  Administered 2022-02-22: 800 mg via ORAL
  Filled 2022-02-22: qty 1

## 2022-02-22 NOTE — ED Provider Notes (Signed)
? ?[facility] ?Provider Note ? ?Patient Contact: 10:21 PM (approximate) ? ? ?History  ? ?Animal Bite ? ? ?HPI ? ?Vickie Scott is a 41 y.o. female who presents the emergency department complaining of a dog bite to the right forearm.  Patient's dogs were fighting when she tried to separate them and accidentally got bit.  Patient has multiple puncture wounds as well as 2 large lacerations to the forearm.  No other injury or complaint.  She is up-to-date on immunizations.  The dog to her own, up-to-date on rabies shots and no concern for rabies. ?  ? ? ?Physical Exam  ? ?Triage Vital Signs: ?ED Triage Vitals  ?Enc Vitals Group  ?   BP 02/22/22 2042 (!) 154/104  ?   Pulse Rate 02/22/22 2042 (!) 115  ?   Resp 02/22/22 2042 20  ?   Temp 02/22/22 2042 98.5 ?F (36.9 ?C)  ?   Temp Source 02/22/22 2042 Oral  ?   SpO2 02/22/22 2042 100 %  ?   Weight 02/22/22 2043 138 lb (62.6 kg)  ?   Height 02/22/22 2043 5\' 5"  (1.651 m)  ?   Head Circumference --   ?   Peak Flow --   ?   Pain Score 02/22/22 2042 5  ?   Pain Loc --   ?   Pain Edu? --   ?   Excl. in [place]? --   ? ? ?Most recent vital signs: ?Vitals:  ? 02/22/22 2042  ?BP: (!) 154/104  ?Pulse: (!) 115  ?Resp: 20  ?Temp: 98.5 ?F (36.9 ?C)  ?SpO2: 100%  ? ? ? ?General: Alert and in no acute distress.   ?Cardiovascular:  Good peripheral perfusion ?Respiratory: Normal respiratory effort without tachypnea or retractions. Lungs CTAB.  ?Musculoskeletal: Full range of motion to all extremities.  Visualization of the right forearm reveals multiple puncture wounds to the dorsal aspect, anterior forearm has 2 large lacerations both measuring approximately 8 cm in length.  Subcutaneous fat tissue exposed.  No active bleeding.  No visible foreign body.  Full range of motion to the joints above and below laceration and puncture wounds. ?Neurologic:  No gross focal neurologic deficits are appreciated.  ?Skin:   No rash noted ?Other: ? ? ?ED Results / Procedures / Treatments   ? ?Labs ?(all labs ordered are listed, but only abnormal results are displayed) ?Labs Reviewed - No data to display ? ? ?EKG ? ? ? ? ?RADIOLOGY ? ?I personally viewed and evaluated these images as part of my medical decision making, as well as reviewing the written report by the radiologist. ? ?ED Provider Interpretation: No acute fracture or retained foreign body ? ?DG Forearm Right ? ?Result Date: 02/22/2022 ?CLINICAL DATA:  Dog bite EXAM: RIGHT FOREARM - 2 VIEW COMPARISON:  None. FINDINGS: Normal alignment. No acute fracture or dislocation. Joint spaces are preserved. Subcutaneous gas is seen along the volar surface of the right forearm in keeping with the given history of bite wound. No retained radiopaque foreign body. IMPRESSION: Soft tissue wound along the volar surface of the right forearm. No retained radiopaque foreign body. No fracture or dislocation. Electronically Signed   By: Fidela Salisbury M.D.   On: 02/22/2022 21:24   ? ?PROCEDURES: ? ?Critical Care performed: No ? ?Marland Kitchen.Laceration Repair ? ?Date/Time: 02/23/2022 12:25 AM ?Performed by: Darletta Moll, PA-C ?Authorized by: Darletta Moll, PA-C  ? ?Consent:  ?  Consent obtained:  Verbal ?  Consent given by:  Patient ?  Risks discussed:  Infection, pain, poor cosmetic result, poor wound healing and need for additional repair ?Universal protocol:  ?  Procedure explained and questions answered to patient or proxy's satisfaction: yes   ?  Immediately prior to procedure, a time out was called: yes   ?  Patient identity confirmed:  Verbally with patient ?Anesthesia:  ?  Anesthesia method:  Local infiltration ?  Local anesthetic:  Lidocaine 1% w/o epi ?Laceration details:  ?  Location:  Shoulder/arm ?  Shoulder/arm location:  R lower arm ?  Length (cm):  8 ?Exploration:  ?  Hemostasis achieved with:  Direct pressure ?  Imaging obtained: x-ray   ?  Imaging outcome: foreign body not noted   ?  Wound exploration: wound explored through full range of  motion and entire depth of wound visualized   ?  Wound extent: no foreign bodies/material noted and no underlying fracture noted   ?  Contaminated: yes   ?Treatment:  ?  Area cleansed with:  Povidone-iodine and saline ?  Amount of cleaning:  Extensive ?  Irrigation solution:  Sterile saline ?  Irrigation volume:  1L ?  Irrigation method:  Syringe ?Skin repair:  ?  Repair method:  Sutures ?  Suture size:  4-0 ?  Suture material:  Nylon ?  Suture technique:  Simple interrupted ?  Number of sutures:  4 ?Approximation:  ?  Approximation:  Loose ?Repair type:  ?  Repair type:  Simple ?Post-procedure details:  ?  Dressing:  Open (no dressing) ?  Procedure completion:  Tolerated well, no immediate complications ?Marland Kitchen.Laceration Repair ? ?Date/Time: 02/23/2022 12:26 AM ?Performed by: Darletta Moll, PA-C ?Authorized by: Darletta Moll, PA-C  ? ?Consent:  ?  Consent obtained:  Verbal ?  Consent given by:  Patient ?  Risks discussed:  Infection, pain, poor wound healing, poor cosmetic result and need for additional repair ?Universal protocol:  ?  Procedure explained and questions answered to patient or proxy's satisfaction: yes   ?  Immediately prior to procedure, a time out was called: yes   ?  Patient identity confirmed:  Verbally with patient ?Anesthesia:  ?  Anesthesia method:  Local infiltration ?  Local anesthetic:  Lidocaine 1% w/o epi ?Laceration details:  ?  Location:  Shoulder/arm ?  Shoulder/arm location:  R lower arm ?  Length (cm):  8 ?Pre-procedure details:  ?  Preparation:  Patient was prepped and draped in usual sterile fashion and imaging obtained to evaluate for foreign bodies ?Exploration:  ?  Imaging obtained: x-ray   ?  Imaging outcome: foreign body not noted   ?  Wound exploration: wound explored through full range of motion and entire depth of wound visualized   ?  Wound extent: no foreign bodies/material noted and no underlying fracture noted   ?  Contaminated: yes   ?Treatment:  ?  Area  cleansed with:  Povidone-iodine and saline ?  Amount of cleaning:  Extensive ?  Irrigation solution:  Sterile saline ?  Irrigation volume:  1L ?  Irrigation method:  Syringe ?Skin repair:  ?  Repair method:  Sutures ?  Suture size:  4-0 ?  Suture material:  Nylon ?  Suture technique:  Simple interrupted ?  Number of sutures:  3 ?Approximation:  ?  Approximation:  Loose ?Repair type:  ?  Repair type:  Simple ?Post-procedure details:  ?  Procedure completion:  Tolerated well, no immediate complications ?  Comments:  ?   Patient had 2 large lacerations to the forearm that required repair despite the fact that these were animal bites.  These were too large to heal on their own.  Using wide spaced sutures with loose approximation, wounds were closed as described above ? ? ?MEDICATIONS ORDERED IN ED: ?Medications  ?amoxicillin-clavulanate (AUGMENTIN) 875-125 MG per tablet 1 tablet (1 tablet Oral Given 02/22/22 2051)  ?ibuprofen (ADVIL) tablet 800 mg (800 mg Oral Given 02/22/22 2050)  ?lidocaine (PF) (XYLOCAINE) 1 % injection 15 mL (15 mLs Infiltration Given by Other 02/22/22 2238)  ? ? ? ?IMPRESSION / MDM / ASSESSMENT AND PLAN / ED COURSE  ?I reviewed the triage vital signs and the nursing notes. ?             ?               ? ?Differential diagnosis includes, but is not limited to, dog bite ? ? ?Patient's diagnosis is consistent with dog bite to the forearm.  Patient presents emergency department after trying to break up her dogs fighting.  She sustained 2 lacerations to the forearm that required loose closure.  Despite the fact that this was an animal bite, these wounds were too large to heal on their own without some form of primary closure.  Patient had very wide space, loose sutures placed that only loosely approximated these wounds.  Strict wound care instructions discussed with the patient.  Concerning signs and symptoms for infection are discussed at length.  Multiple other puncture wounds that are cleansed.  Antibiotics  prophylactically.  Follow-up with primary care for suture removal..Patient is given ED precautions to return to the ED for any worsening or new symptoms. ? ? ? ?  ? ? ?FINAL CLINICAL IMPRESSION(S) / ED DIAG

## 2022-02-22 NOTE — ED Provider Triage Note (Signed)
Emergency Medicine Provider Triage Evaluation Note ? ?Vickie Scott , a 41 y.o. female  was evaluated in triage.  Pt complains of dog bites to the right forearm.  She was bit just prior to arrival by her own dog.  She reports that her dog is up-to-date on immunization against rabies.  She herself reports that she is up-to-date on her tetanus shot.  She was bit in the right forearm multiple times ? ?Review of Systems  ?Positive: Some pain and discomfort in the bite marks in the right forearm ?Negative: Fever or other injury.  Reports only bit over her right forearm by her own dog ? ?Physical Exam  ?There were no vitals taken for this visit. ?Gen:   Awake, no distress she appears tearful and somewhat anxious. ?Resp:  Normal effort  ?MSK:   Moves extremities without difficulty though she is noted to have some skin avulsion and also puncture wounds to the right volar forearm.  Bleeding is controlled at this time by a towel which the patient had placed over it.  She is able to remove the towel there is no active bleeding ? ? ?Medical Decision Making  ?Medically screening exam initiated at 8:42 PM.  Appropriate orders placed.  MECHEL HAGGARD was informed that the remainder of the evaluation will be completed by another provider, this initial triage assessment does not replace that evaluation, and the importance of remaining in the ED until their evaluation is complete. ? ?I have ordered Augmentin.  Patient affirms no known drug allergies.  Have also ordered an x-ray of the right forearm to assess for foreign body or possible retained teeth though no obvious foreign body on my visual. ?  Sharyn Creamer, MD ?02/22/22 2046 ? ?

## 2022-02-22 NOTE — ED Triage Notes (Signed)
Pt presents to ER from home c/o dog bite to right forearm that happened around 40 minutes ago.  Pt states she was trying to keep dog apart and was attacked by one of her own dogs.  Pt has open wounds noted to right forearm with several scattered puncture wounds.  Pt is A&O x4 at this time in NAD.  Pt states dogs were her own and are UTD on rabies shots.  ?

## 2022-02-23 MED ORDER — AMOXICILLIN-POT CLAVULANATE 875-125 MG PO TABS: 1.0000 | ORAL_TABLET | Freq: Two times a day (BID) | ORAL | 0 refills | Status: AC

## 2022-02-23 NOTE — ED Notes (Signed)
Pt signed esignature. 

## 2022-09-07 IMAGING — CR DG FOREARM 2V*R*
1 series · 2 of 2 positions shown · non-contrast
Comparison: None.

CLINICAL DATA: Dog bite

EXAM:
RIGHT FOREARM - 2 VIEW

[Series 1: dg forearm right · 0.14mm/px · 2 of 2 slices shown]
[im 1/2]
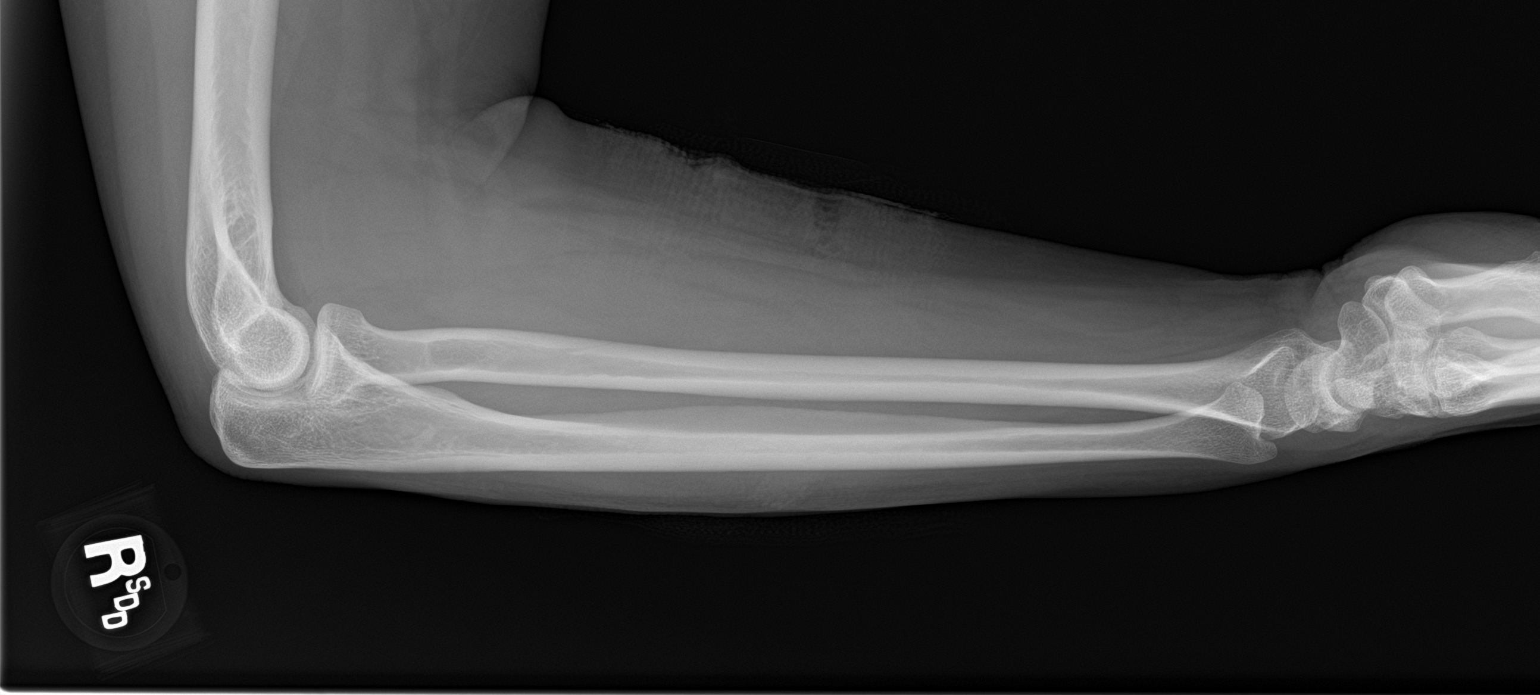
[im 2/2]
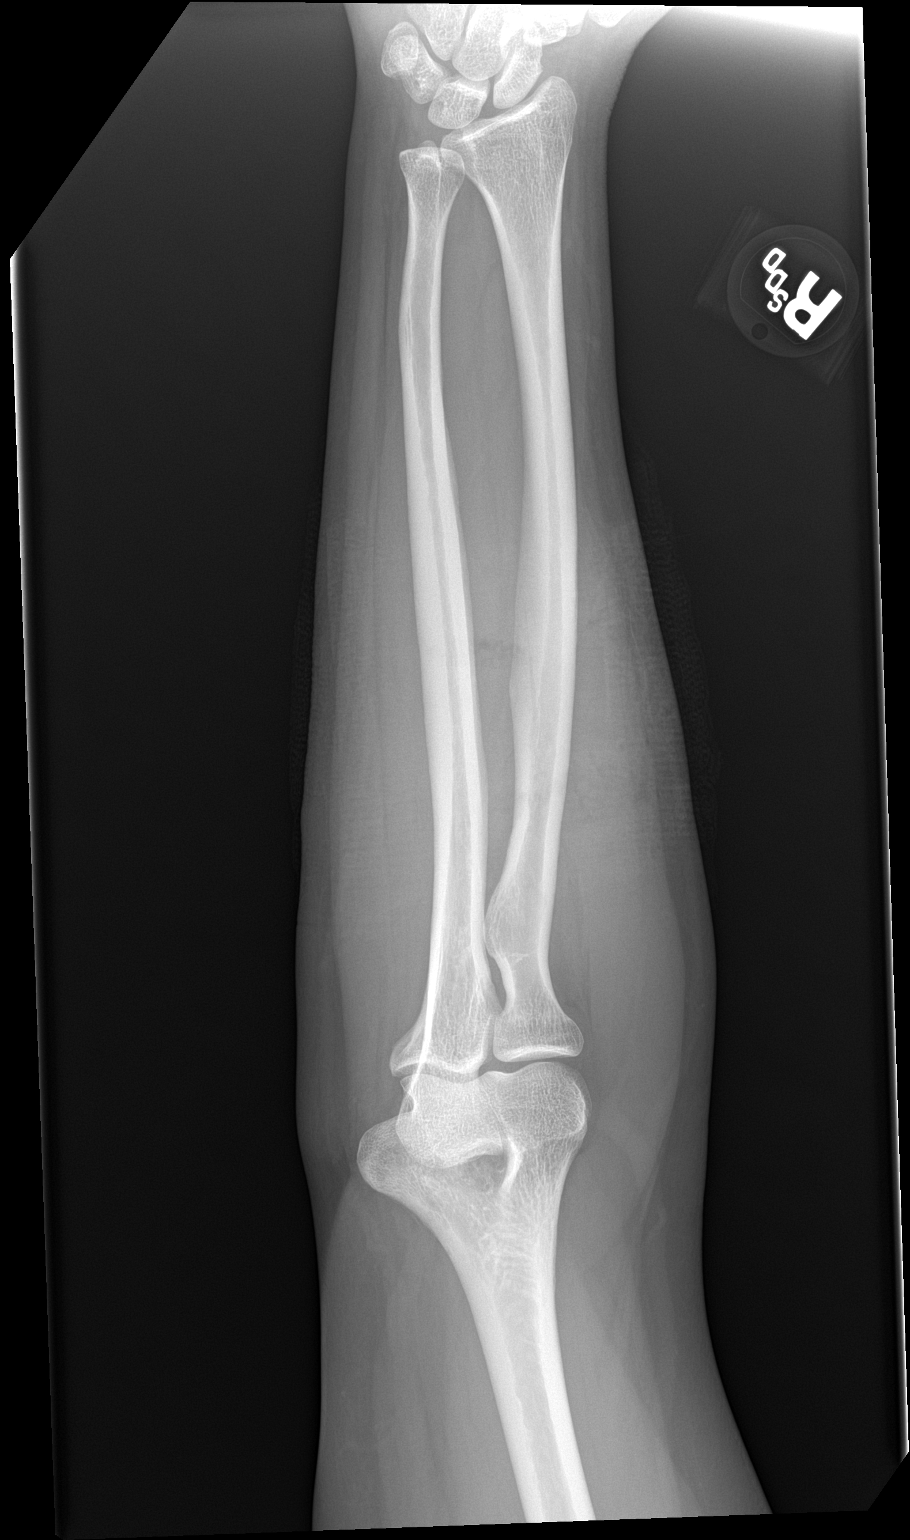

[2 of 2 positions shown; findings below may reference images not displayed]

FINDINGS: Normal alignment. No acute fracture or dislocation. Joint spaces are
preserved. Subcutaneous gas is seen along the volar surface of the
right forearm in keeping with the given history of bite wound. No
retained radiopaque foreign body.
IMPRESSION: Soft tissue wound along the volar surface of the right forearm. No
retained radiopaque foreign body. No fracture or dislocation.

## 2022-12-24 LAB — HM MAMMOGRAPHY

## 2022-12-25 LAB — LAB REPORT - SCANNED: EGFR: 113

## 2024-08-16 ENCOUNTER — Ambulatory Visit: Payer: Self-pay

## 2024-08-16 NOTE — Telephone Encounter (Signed)
   Reason for Disposition  SEVERE PAIN WITH DYSPHAGIA (e.g., can't swallow any liquids, or drooling)  Answer Assessment - Initial Assessment Questions Everything is swollen, white patches on sides of throat, bottom of tongue and inside of lips.Throat feels like its on fire. Denies inhaler or steroid use. Mentioned drooling and the constant need to spit.. Lips swollen, tongue swollen.   1. ONSET: When did the throat start hurting? (Hours or days ago)      First of the week  2. SEVERITY: How bad is the sore throat? (Scale 1-10; mild, moderate or severe)     Severe, can barely swallow      3. FEVER: Do you have a fever? If Yes, ask: What is your temperature, how was it measured, and when did it start?     No  4. OTHER SYMPTOMS: Do you have any other symptoms? (e.g., difficulty breathing, headache, rash)     Difficulty breathing, feels like something in stuck in throat.  Protocols used: Sore Throat-A-AH  Copied from CRM #[id number]. Topic: Clinical - Red Word Triage >> Aug 16, 2024  5:57 PM Delon HERO wrote: Red Word that prompted transfer to Nurse Triage: Patient is calling to report that she thinks that she has thrush that started Monday with sore throat.  With swollen lips, with white spots, in lump in throat and loose voice.
